# Patient Record
Sex: Male | Born: 1965 | Race: White | Hispanic: No | Marital: Married | Smoking: Current every day smoker
Health system: Southern US, Community
[De-identification: ages and names within clinical notes are randomized; demographics above are authoritative.]

## PROBLEM LIST (undated history)

## (undated) DIAGNOSIS — F419 Anxiety disorder, unspecified: Secondary | ICD-10-CM

## (undated) DIAGNOSIS — F32A Depression, unspecified: Secondary | ICD-10-CM

## (undated) DIAGNOSIS — F329 Major depressive disorder, single episode, unspecified: Secondary | ICD-10-CM

## (undated) HISTORY — PX: NECK SURGERY: SHX720

---

## 2005-06-06 ENCOUNTER — Emergency Department: Payer: Self-pay | Admitting: Emergency Medicine

## 2005-06-22 ENCOUNTER — Emergency Department: Payer: Self-pay | Admitting: Emergency Medicine

## 2005-09-01 ENCOUNTER — Other Ambulatory Visit: Payer: Self-pay

## 2005-09-01 ENCOUNTER — Emergency Department: Payer: Self-pay | Admitting: Unknown Physician Specialty

## 2008-10-10 ENCOUNTER — Emergency Department: Payer: Self-pay | Admitting: Emergency Medicine

## 2008-11-17 ENCOUNTER — Emergency Department: Payer: Self-pay | Admitting: Emergency Medicine

## 2008-11-25 ENCOUNTER — Emergency Department: Payer: Self-pay | Admitting: Emergency Medicine

## 2008-11-29 ENCOUNTER — Emergency Department: Payer: Self-pay | Admitting: Unknown Physician Specialty

## 2009-08-24 ENCOUNTER — Emergency Department: Payer: Self-pay | Admitting: Unknown Physician Specialty

## 2009-10-14 ENCOUNTER — Emergency Department: Payer: Self-pay | Admitting: Emergency Medicine

## 2010-02-23 ENCOUNTER — Emergency Department: Payer: Self-pay | Admitting: Emergency Medicine

## 2010-07-22 ENCOUNTER — Emergency Department: Payer: Self-pay | Admitting: Emergency Medicine

## 2011-04-14 ENCOUNTER — Emergency Department: Payer: Self-pay | Admitting: Emergency Medicine

## 2012-04-30 ENCOUNTER — Emergency Department: Payer: Self-pay | Admitting: Emergency Medicine

## 2012-07-24 ENCOUNTER — Ambulatory Visit: Payer: Self-pay | Admitting: Family Medicine

## 2012-08-28 ENCOUNTER — Ambulatory Visit: Payer: Self-pay | Admitting: Orthopedic Surgery

## 2012-09-03 ENCOUNTER — Emergency Department: Payer: Self-pay | Admitting: Emergency Medicine

## 2012-09-06 ENCOUNTER — Ambulatory Visit: Payer: Self-pay | Admitting: Family Medicine

## 2012-10-23 ENCOUNTER — Emergency Department: Payer: Self-pay | Admitting: Emergency Medicine

## 2012-12-11 ENCOUNTER — Emergency Department: Payer: Self-pay | Admitting: Emergency Medicine

## 2013-03-14 ENCOUNTER — Emergency Department: Payer: Self-pay | Admitting: Emergency Medicine

## 2013-03-14 LAB — URINALYSIS, COMPLETE
Bacteria: NONE SEEN
Bilirubin,UR: NEGATIVE
Glucose,UR: NEGATIVE mg/dL (ref 0–75)
Ketone: NEGATIVE
Protein: 30
RBC,UR: 1478 /HPF (ref 0–5)
Specific Gravity: 1.023 (ref 1.003–1.030)
WBC UR: 1 /HPF (ref 0–5)

## 2013-03-14 LAB — BASIC METABOLIC PANEL
BUN: 8 mg/dL (ref 7–18)
Calcium, Total: 9.2 mg/dL (ref 8.5–10.1)
Co2: 26 mmol/L (ref 21–32)
Creatinine: 0.95 mg/dL (ref 0.60–1.30)
EGFR (Non-African Amer.): >60
Osmolality: 270 (ref 275–301)

## 2013-03-14 LAB — CBC
HGB: 13.6 g/dL (ref 13.0–18.0)
MCV: 88 fL (ref 80–100)
Platelet: 257 10*3/uL (ref 150–440)
RBC: 4.32 10*6/uL — ABNORMAL LOW (ref 4.40–5.90)
WBC: 6.1 10*3/uL (ref 3.8–10.6)

## 2013-06-28 ENCOUNTER — Emergency Department: Payer: Self-pay | Admitting: Emergency Medicine

## 2013-06-28 LAB — URINALYSIS, COMPLETE
BILIRUBIN, UR: NEGATIVE
BLOOD: NEGATIVE
Bacteria: NONE SEEN
GLUCOSE, UR: NEGATIVE mg/dL (ref 0–75)
KETONE: NEGATIVE
Leukocyte Esterase: NEGATIVE
Nitrite: NEGATIVE
Ph: 6 (ref 4.5–8.0)
Protein: NEGATIVE
RBC,UR: NONE SEEN /HPF (ref 0–5)
Specific Gravity: 1.01 (ref 1.003–1.030)
Squamous Epithelial: NONE SEEN
WBC UR: 1 /HPF (ref 0–5)

## 2013-06-28 LAB — COMPREHENSIVE METABOLIC PANEL
ALBUMIN: 4.4 g/dL (ref 3.4–5.0)
AST: 37 U/L (ref 15–37)
Alkaline Phosphatase: 95 U/L
Anion Gap: 5 — ABNORMAL LOW (ref 7–16)
BILIRUBIN TOTAL: 0.3 mg/dL (ref 0.2–1.0)
BUN: 9 mg/dL (ref 7–18)
CO2: 27 mmol/L (ref 21–32)
Calcium, Total: 8.9 mg/dL (ref 8.5–10.1)
Chloride: 103 mmol/L (ref 98–107)
Creatinine: 0.75 mg/dL (ref 0.60–1.30)
GLUCOSE: 143 mg/dL — AB (ref 65–99)
Osmolality: 271 (ref 275–301)
Potassium: 3.9 mmol/L (ref 3.5–5.1)
SGPT (ALT): 43 U/L (ref 12–78)
SODIUM: 135 mmol/L — AB (ref 136–145)
Total Protein: 8.1 g/dL (ref 6.4–8.2)

## 2013-06-28 LAB — ETHANOL
ETHANOL LVL: 104 mg/dL
Ethanol %: 0.104 % — ABNORMAL HIGH (ref 0.000–0.080)

## 2013-06-28 LAB — CBC
HCT: 39.1 % — ABNORMAL LOW (ref 40.0–52.0)
HGB: 13.9 g/dL (ref 13.0–18.0)
MCH: 31.6 pg (ref 26.0–34.0)
MCHC: 35.5 g/dL (ref 32.0–36.0)
MCV: 89 fL (ref 80–100)
Platelet: 229 10*3/uL (ref 150–440)
RBC: 4.38 10*6/uL — ABNORMAL LOW (ref 4.40–5.90)
RDW: 13.2 % (ref 11.5–14.5)
WBC: 6.4 10*3/uL (ref 3.8–10.6)

## 2013-06-28 LAB — DRUG SCREEN, URINE
Amphetamines, Ur Screen: NEGATIVE (ref ?–1000)
BARBITURATES, UR SCREEN: NEGATIVE (ref ?–200)
BENZODIAZEPINE, UR SCRN: POSITIVE (ref ?–200)
Cannabinoid 50 Ng, Ur ~~LOC~~: NEGATIVE (ref ?–50)
Cocaine Metabolite,Ur ~~LOC~~: NEGATIVE (ref ?–300)
MDMA (ECSTASY) UR SCREEN: NEGATIVE (ref ?–500)
Methadone, Ur Screen: NEGATIVE (ref ?–300)
Opiate, Ur Screen: POSITIVE (ref ?–300)
Phencyclidine (PCP) Ur S: NEGATIVE (ref ?–25)
Tricyclic, Ur Screen: NEGATIVE (ref ?–1000)

## 2013-06-28 LAB — ACETAMINOPHEN LEVEL: Acetaminophen: <2 ug/mL

## 2013-06-28 LAB — SALICYLATE LEVEL: Salicylates, Serum: <1.7 mg/dL

## 2013-10-08 ENCOUNTER — Emergency Department: Payer: Self-pay | Admitting: Emergency Medicine

## 2014-04-01 IMAGING — CT CT STONE STUDY
1 of 2 series · 16 of 32 positions shown, 20 images · non-contrast
Comparison: none

REASON FOR EXAM: right flank pain
COMMENTS:

PROCEDURE:     CT  - CT ABDOMEN /PELVIS WO (STONE)  - March 14, 2013  [DATE]
RESULT:     History: Right flank pain.
Comparison Study: Renal ultrasound 04/15/2011.

[Series 2: 3mm soft tissue · axial · 0.68mm/px · z∈[-510,-94]mm · 16 of 153 slices shown, 20 images]
[im 7/153  soft-tissue]
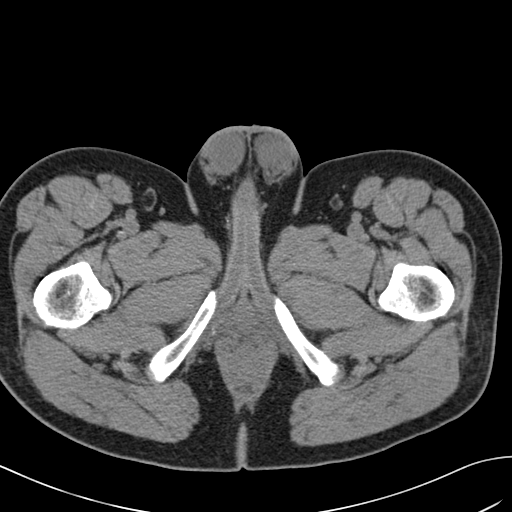
[im 7/153  bone]
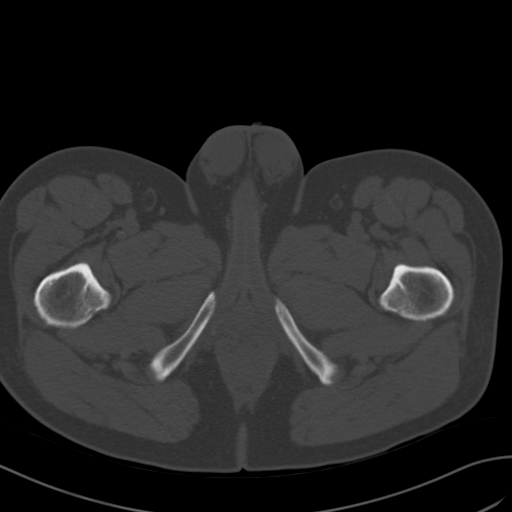
[im 20/153  soft-tissue]
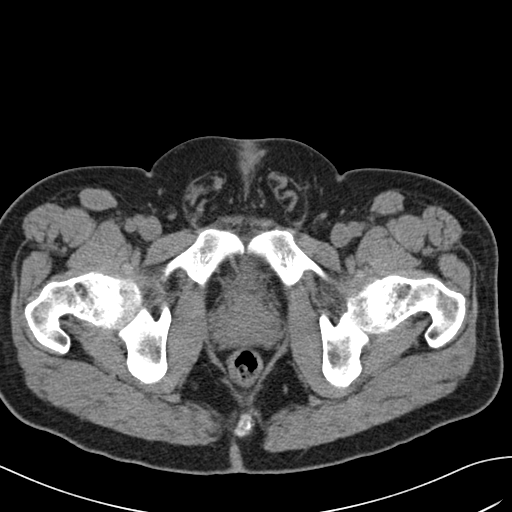
[im 32/153  soft-tissue]
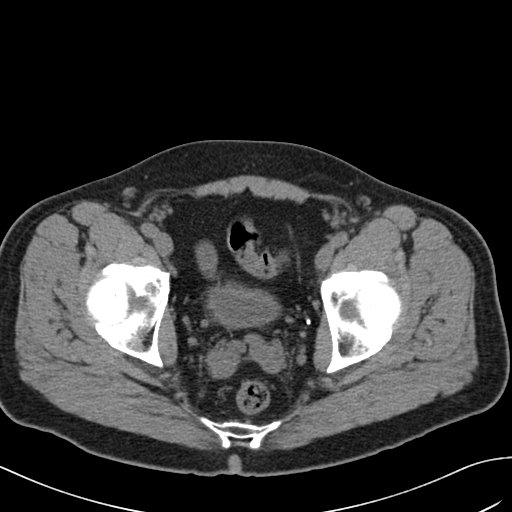
[im 39/153  soft-tissue]
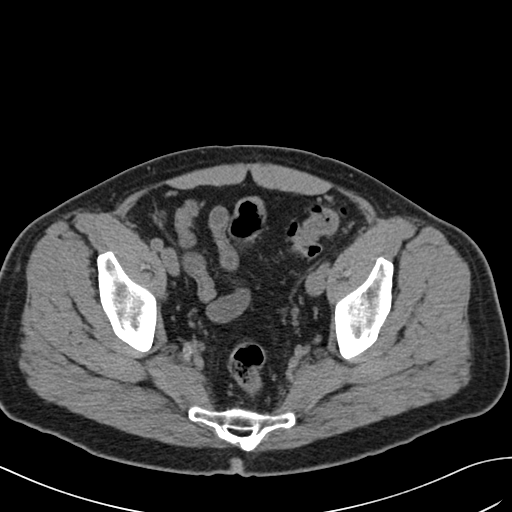
[im 51/153  soft-tissue]
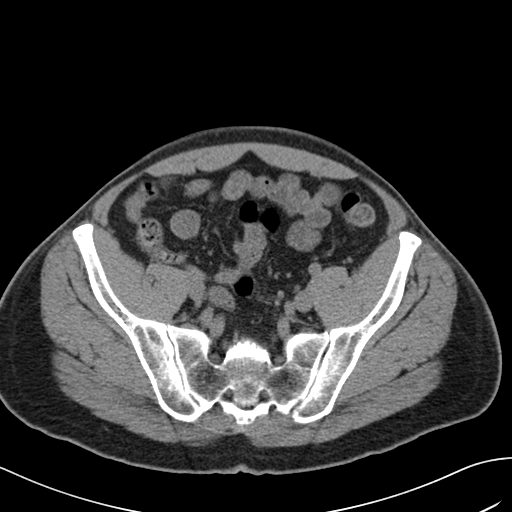
[im 64/153  soft-tissue]
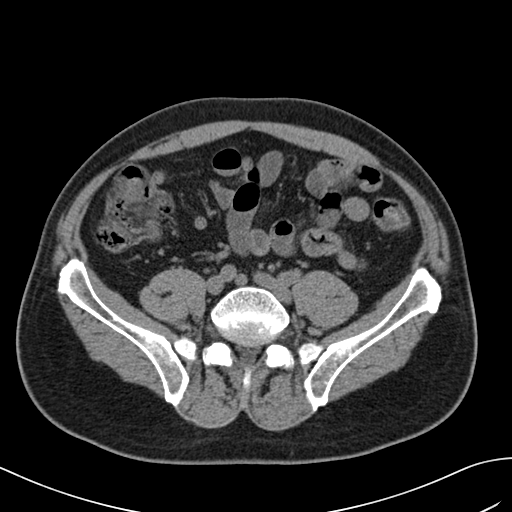
[im 70/153  soft-tissue]
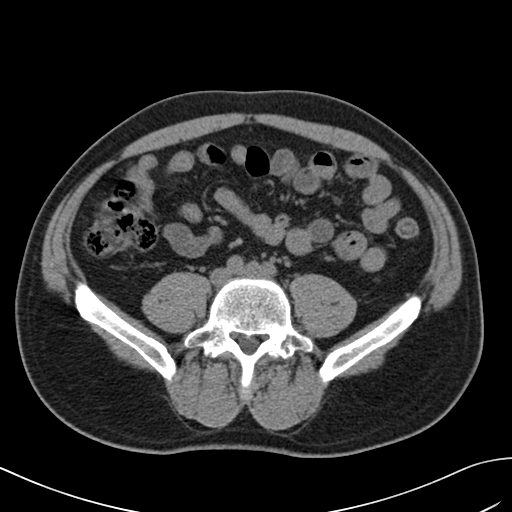
[im 83/153  soft-tissue]
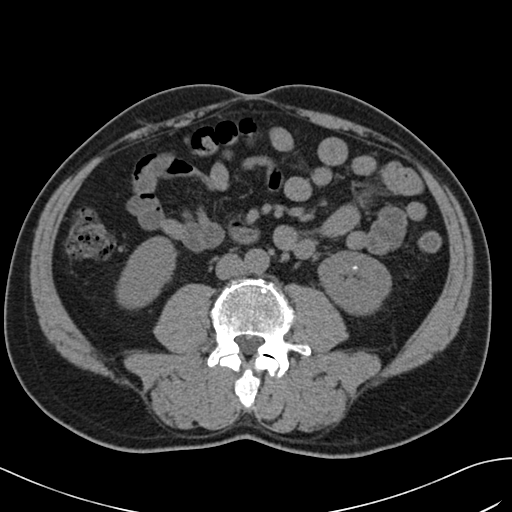
[im 89/153  soft-tissue]
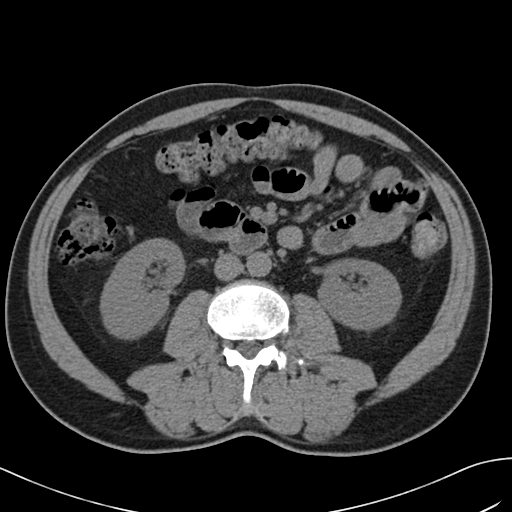
[im 89/153  bone]
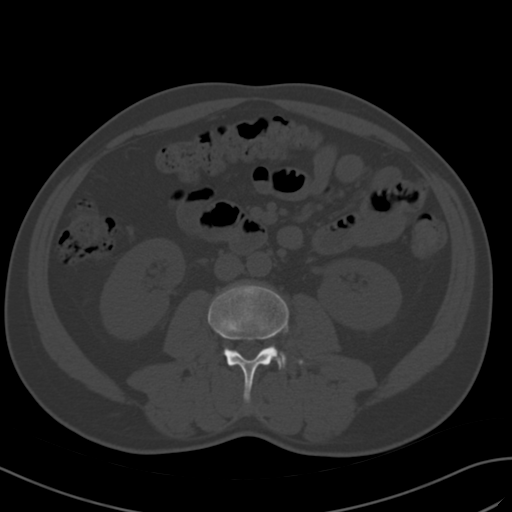
[im 102/153  soft-tissue]
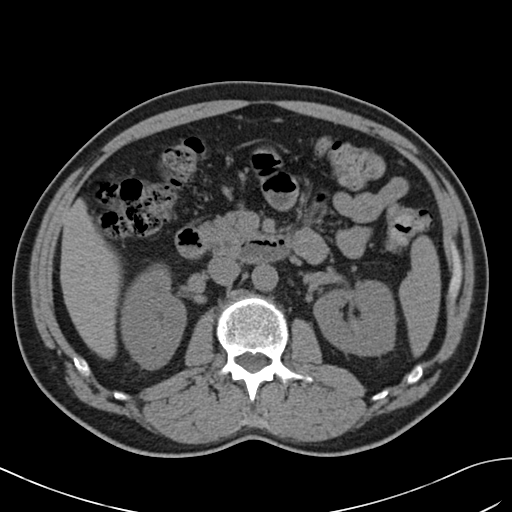
[im 115/153  soft-tissue]
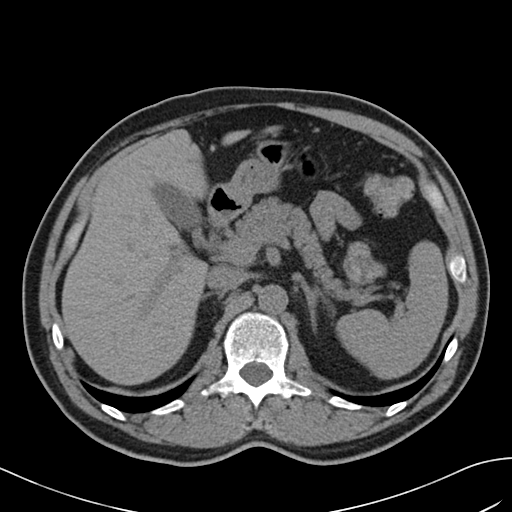
[im 121/153  soft-tissue]
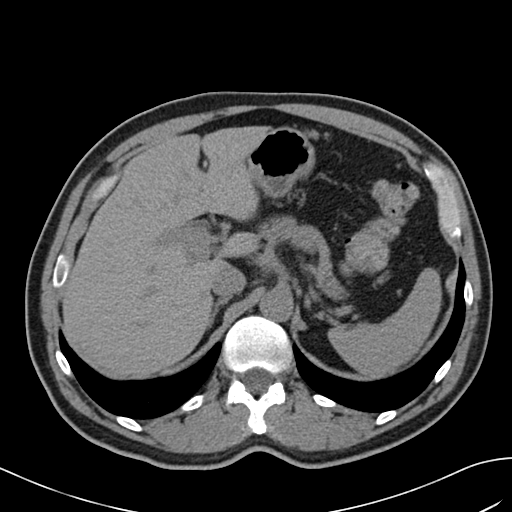
[im 127/153  lung]
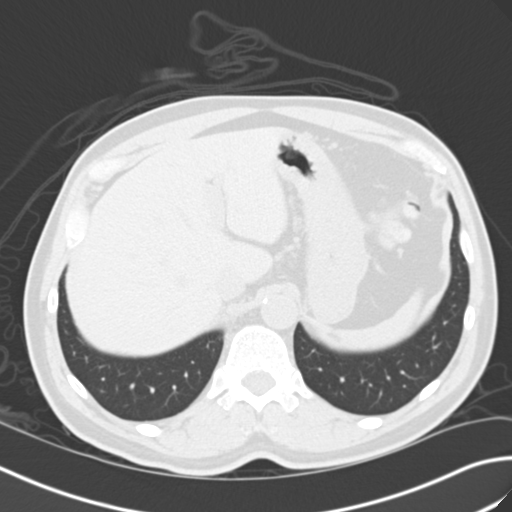
[im 134/153  soft-tissue]
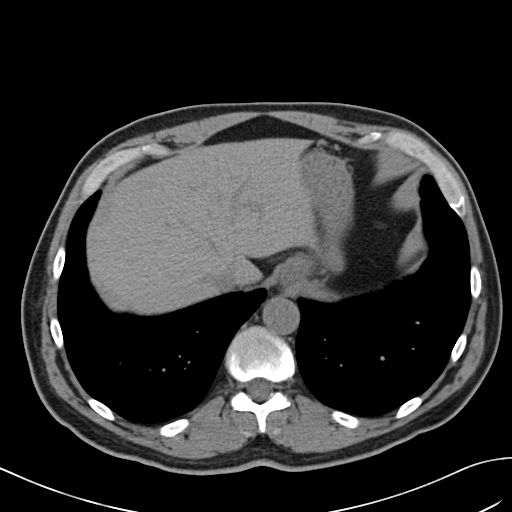
[im 134/153  lung]
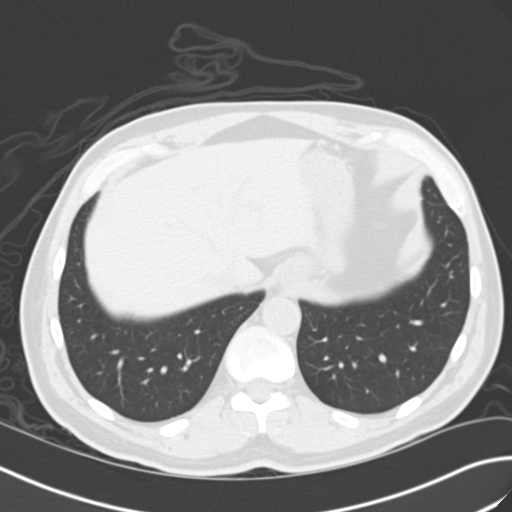
[im 140/153  lung]
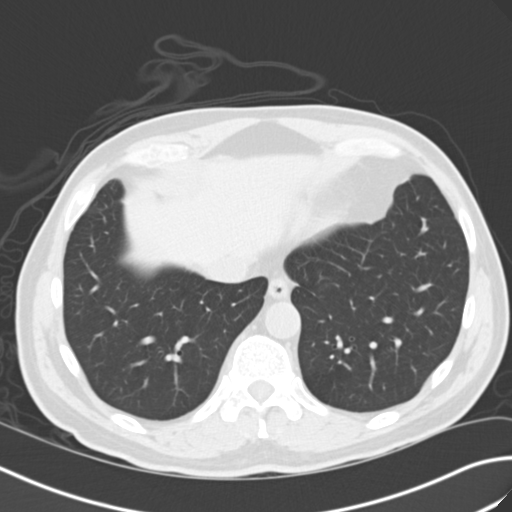
[im 146/153  soft-tissue]
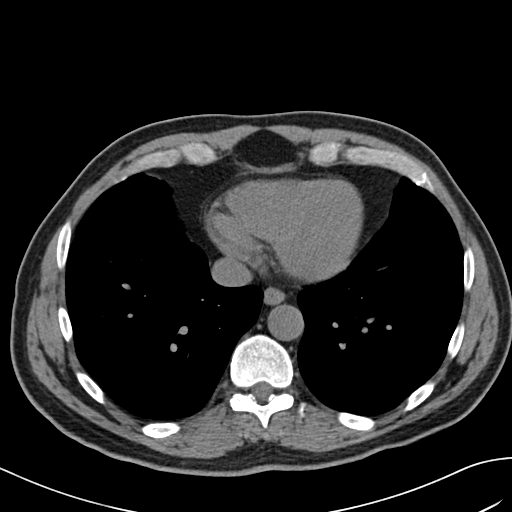
[im 146/153  lung]
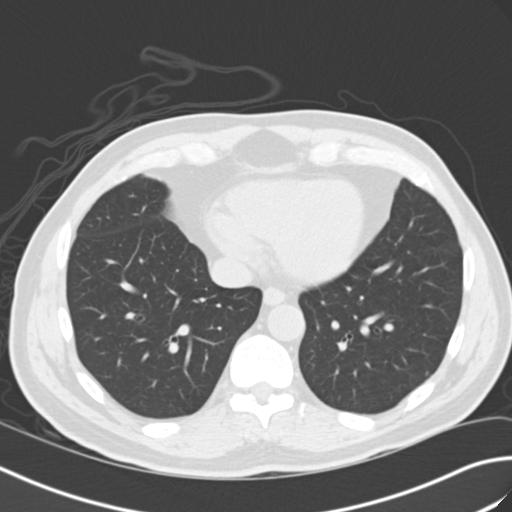

[16 of 32 positions shown; findings below may reference images not displayed]

FINDINGS: Standard nonenhanced CT obtained. Evaluation in 3 dimensions on
separate workstation performed. Liver normal. Spleen normal. Pancreas is
normal. No biliary distention. Gallbladder nondistended.

Adrenals normal. Bilateral nephrolithiasis is present. No urolithiasis.
Bladder is nondistended. Prostate is slightly prominent. No free pelvic
fluid.

No adenopathy. Aorta normal caliber.

Kidneys normal. No bowel distention or free air. Esophageal gastric regions
are normal.

Lung bases are clear. Heart size normal. No acute bony abnormality.
IMPRESSION: Bilateral nonobstructing renal calyceal stones. No evidence
of ureteral stone.

## 2014-10-24 ENCOUNTER — Emergency Department
Admission: EM | Admit: 2014-10-24 | Discharge: 2014-10-24 | Disposition: A | Discharge status: Home / Self Care | Payer: Self-pay | Attending: Emergency Medicine | Admitting: Emergency Medicine

## 2014-10-24 ENCOUNTER — Encounter: Payer: Self-pay | Admitting: Emergency Medicine

## 2014-10-24 DIAGNOSIS — F329 Major depressive disorder, single episode, unspecified: Secondary | ICD-10-CM | POA: Insufficient documentation

## 2014-10-24 DIAGNOSIS — Z720 Tobacco use: Secondary | ICD-10-CM | POA: Insufficient documentation

## 2014-10-24 DIAGNOSIS — F32A Depression, unspecified: Secondary | ICD-10-CM

## 2014-10-24 DIAGNOSIS — F1210 Cannabis abuse, uncomplicated: Secondary | ICD-10-CM | POA: Insufficient documentation

## 2014-10-24 DIAGNOSIS — Z79899 Other long term (current) drug therapy: Secondary | ICD-10-CM | POA: Insufficient documentation

## 2014-10-24 DIAGNOSIS — F419 Anxiety disorder, unspecified: Secondary | ICD-10-CM | POA: Insufficient documentation

## 2014-10-24 HISTORY — DX: Major depressive disorder, single episode, unspecified: F32.9

## 2014-10-24 HISTORY — DX: Anxiety disorder, unspecified: F41.9

## 2014-10-24 HISTORY — DX: Depression, unspecified: F32.A

## 2014-10-24 LAB — CBC WITH DIFFERENTIAL/PLATELET
BASOS PCT: 1 %
Basophils Absolute: 0.1 10*3/uL (ref 0–0.1)
EOS ABS: 0.2 10*3/uL (ref 0–0.7)
Eosinophils Relative: 1 %
HCT: 45.6 % (ref 40.0–52.0)
Hemoglobin: 15.3 g/dL (ref 13.0–18.0)
Lymphocytes Relative: 15 %
Lymphs Abs: 1.6 10*3/uL (ref 1.0–3.6)
MCH: 31.6 pg (ref 26.0–34.0)
MCHC: 33.6 g/dL (ref 32.0–36.0)
MCV: 94 fL (ref 80.0–100.0)
Monocytes Absolute: 0.5 10*3/uL (ref 0.2–1.0)
Monocytes Relative: 5 %
NEUTROS ABS: 8.4 10*3/uL — AB (ref 1.4–6.5)
NEUTROS PCT: 78 %
PLATELETS: 237 10*3/uL (ref 150–440)
RBC: 4.86 MIL/uL (ref 4.40–5.90)
RDW: 14 % (ref 11.5–14.5)
WBC: 10.8 10*3/uL — ABNORMAL HIGH (ref 3.8–10.6)

## 2014-10-24 LAB — COMPREHENSIVE METABOLIC PANEL
ALBUMIN: 4.4 g/dL (ref 3.5–5.0)
ALT: 30 U/L (ref 17–63)
AST: 29 U/L (ref 15–41)
Alkaline Phosphatase: 69 U/L (ref 38–126)
Anion gap: 6 (ref 5–15)
BUN: 10 mg/dL (ref 6–20)
CO2: 30 mmol/L (ref 22–32)
CREATININE: 0.87 mg/dL (ref 0.61–1.24)
Calcium: 9.3 mg/dL (ref 8.9–10.3)
Chloride: 104 mmol/L (ref 101–111)
GFR calc Af Amer: >60 mL/min (ref >60)
GLUCOSE: 82 mg/dL (ref 65–99)
Potassium: 3.9 mmol/L (ref 3.5–5.1)
Sodium: 140 mmol/L (ref 135–145)
Total Bilirubin: 0.4 mg/dL (ref 0.3–1.2)
Total Protein: 7.5 g/dL (ref 6.5–8.1)

## 2014-10-24 LAB — URINE DRUG SCREEN, QUALITATIVE (ARMC ONLY)
Amphetamines, Ur Screen: NOT DETECTED
BARBITURATES, UR SCREEN: NOT DETECTED
BENZODIAZEPINE, UR SCRN: NOT DETECTED
CANNABINOID 50 NG, UR ~~LOC~~: POSITIVE — AB
COCAINE METABOLITE, UR ~~LOC~~: NOT DETECTED
MDMA (Ecstasy)Ur Screen: NOT DETECTED
Methadone Scn, Ur: NOT DETECTED
OPIATE, UR SCREEN: NOT DETECTED
Phencyclidine (PCP) Ur S: NOT DETECTED
Tricyclic, Ur Screen: NOT DETECTED

## 2014-10-24 LAB — ETHANOL: Alcohol, Ethyl (B): 52 mg/dL — ABNORMAL HIGH (ref <5)

## 2014-10-24 NOTE — ED Notes (Signed)
Pt states the he took his wife klonopin his am, to help with his anxiety. Pt states that he is extremely anxious and he is scared that he might hurt someone. Pt was brought in by his brother. Pt was pleasant to talk with, pt in NAD at this time will cont to monitor pt at all times.

## 2014-10-24 NOTE — ED Notes (Signed)
Patient verbalized understanding of instructions given, no further questions verbalized. Unable to sign e-sign at this time due to system malfunction.

## 2014-10-24 NOTE — ED Notes (Addendum)
Pt reports feeling like he is having a nervous breakdown for the last several months, pt denies SI,HI

## 2014-10-24 NOTE — BH Assessment (Signed)
Assessment Note  Willie Lane is an 49 y.o. male Who presents to the ER via a friend due to concerns about his anxiety. He states it's frequency and intensity has increased over the last couple days. Symptoms he reports of having are; shortenings of breath, tightening chest and sweating. Pt. denies SI/HI and AV/H. He does admit to being easily agitated and frustrated. He is currently being followed by Dr Suzie PortelaMoffitt, with [facility]. He is currently taking Prozac to help with is depression. His last appointment was 09/09/2014. He reports of being giving a two-month supply of medication. He spoke with the Psych MD about his current medications and the Dr. felt the pt. was in need of any changes. Pt. admits to an increase of alcohol use, to help cope through his anxiety. For the last month, he has been drinking on daily basis, in the amount of 40 oz's.    He acknowledges he has his wife, two children and a friend as support.  According to him, his wife doesn't agree with his drinking. He friend doesn't agree about his drinking as well. He and his friend attend the same church. 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  He identified his job as the primary stressor in his life. He and a few of his co-workers don't get alone. He denies having any altercation with them. The most he reports of doing is, getting upset and walking away.    Axis I: Anxiety Disorder NOS and Depressive Disorder NOS Axis III:  Past Medical History  Diagnosis Date  . Anxiety   . Depressed    Axis IV: economic problems, occupational problems, other psychosocial or environmental problems and problems related to social environment  Past Medical History:  Past Medical History  Diagnosis Date  . Anxiety   . Depressed     Past Surgical History  Procedure Laterality Date  . Neck surgery      Family History: No family history on file.  Social History:  reports that he has been smoking.   He does not have any smokeless tobacco history on file. He reports that he drinks alcohol. He reports that he does not use illicit drugs.  Additional Social History:  Alcohol / Drug Use Pain Medications: None reported Prescriptions: None reported Over the Counter: None reported History of alcohol / drug use?: Yes Longest period of sobriety (when/how long): Unknown Negative Consequences of Use: Personal relationships Withdrawal Symptoms:  (None reported) Substance #1 Name of Substance 1: Alochol 1 - Age of First Use: Teenager 1 - Amount (size/oz): 40oz 1 - Frequency: Daily 1 - Duration: Approximately Month 1 - Last Use / Amount: 10/23/2014  CIWA: CIWA-Ar BP: 121/67 mmHg Pulse Rate: 82 COWS:    Allergies: No Known Allergies  Home Medications:  (Not in a hospital admission)  OB/GYN Status:  No LMP for male patient.  General Assessment Data [facility] Assessment: In system Is this a Tele or Face-to-Face Assessment?: Face-to-Face Is this an Initial Assessment or a Re-assessment for this encounter?: Initial Assessment Marital status: Married Is patient pregnant?: No Living Arrangements: Spouse/significant other (Wife) Can pt return to current living arrangement?: Yes Admission Status: Voluntary Is patient capable of signing voluntary admission?: Yes Referral Source: Self/Family/Friend  Medical Screening Exam [facility] Walk-in ONLY) Medical Exam completed: Yes  Crisis Care Plan Living Arrangements: Spouse/significant other (Wife) Name of Psychiatrist: Dr. Suzie PortelaMoffitt  Education Status Is patient currently in school?:  No Highest grade of school patient has completed: 12  Risk to self with the past 6 months Suicidal Ideation: No Has patient been a risk to self within the past 6 months prior to admission? : No Suicidal Intent: No Has patient had any suicidal intent within the past 6 months prior to admission? : No Is patient at risk for suicide?:  No Suicidal Plan?: No Has patient had any suicidal plan within the past 6 months prior to admission? : No Access to Means: No What has been your use of drugs/alcohol within the last 12 months?: Alcohol use Previous Attempts/Gestures: No How many times?: 0 Other Self Harm Risks: None reporte Intentional Self Injurious Behavior: None Family Suicide History: No Recent stressful life event(s):  (Recent Job stress) Persecutory voices/beliefs?: No Depression: Yes Depression Symptoms: Loss of interest in usual pleasures, Feeling angry/irritable Substance abuse history and/or treatment for substance abuse?: Yes (Alcohol) Suicide prevention information given to non-admitted patients: Not applicable  Risk to Others within the past 6 months Homicidal Ideation: No Does patient have any lifetime risk of violence toward others beyond the six months prior to admission? : No Thoughts of Harm to Others: No Current Homicidal Intent: No Current Homicidal Plan: No Access to Homicidal Means: No Identified Victim: None reported History of harm to others?: No Assessment of Violence: None Noted Violent Behavior Description: None reported Does patient have access to weapons?: No Criminal Charges Pending?: No Does patient have a court date: No Is patient on probation?: No  Psychosis Hallucinations: None noted Delusions: None noted  Mental Status Report Appearance/Hygiene: In scrubs, Unremarkable Eye Contact: Fair Motor Activity: Unremarkable Speech: Logical/coherent Level of Consciousness: Alert Mood: Anxious, Depressed Affect: Appropriate to circumstance Anxiety Level: Minimal Thought Processes: Coherent, Relevant Judgement: Other (Comment) (WNR) Orientation: Person, Place, Time, Situation, Appropriate for developmental age Obsessive Compulsive Thoughts/Behaviors: None  Cognitive Functioning Concentration: Normal Memory: Recent Intact, Remote Intact IQ: Average Insight: Fair Impulse  Control: Fair Appetite: Good Weight Loss: 0 Weight Gain: 0 Sleep: No Change Total Hours of Sleep: 8 Vegetative Symptoms: None  ADLScreening [facility] Assessment Services) Patient's cognitive ability adequate to safely complete daily activities?: No Patient able to express need for assistance with ADLs?: Yes Independently performs ADLs?: Yes (appropriate for developmental age)  Prior Inpatient Therapy Prior Inpatient Therapy: No  Prior Outpatient Therapy Prior Outpatient Therapy: Yes Prior Therapy Dates: Current Prior Therapy Facilty/Provider(s): [facility] Reason for Treatment: Depression Does patient have an ACCT team?: No Does patient have Intensive In-House Services?  : No Does patient have [facility] services? : No Does patient have P4CC services?: No  ADL Screening (condition at time of admission) Patient's cognitive ability adequate to safely complete daily activities?: No Patient able to express need for assistance with ADLs?: Yes Independently performs ADLs?: Yes (appropriate for developmental age)       Abuse/Neglect Assessment (Assessment to be complete while patient is alone) Physical Abuse: Denies Verbal Abuse: Denies Sexual Abuse: Denies Exploitation of patient/patient's resources: Denies Self-Neglect: Denies Values / Beliefs Cultural Requests During Hospitalization: None Spiritual Requests During Hospitalization: None Consults Spiritual Care Consult Needed: No Social Work Consult Needed: No Merchant navy officerAdvance Directives (For Healthcare) Does patient have an advance directive?: No    Additional Information 1:1 In Past 12 Months?: No CIRT Risk: No Elopement Risk: No Does patient have medical clearance?: Yes  Child/Adolescent Assessment Running Away Risk: Denies (Pt. is an adult)  Disposition:  Disposition Initial Assessment Completed for this Encounter: Yes Disposition of Patient: Outpatient treatment (Follow up  with [facility], his current provider) Type of outpatient  treatment: Adult  On Site Evaluation by:   Reviewed with Physician:    Morley Kos 10/24/2014 4:04 PM

## 2014-10-24 NOTE — ED Notes (Signed)
Pt denies any SI at this time but states he does have thoughts of HI. No acute distress noted in triage.  Pt came to hospital voluntary.

## 2014-10-24 NOTE — ED Provider Notes (Signed)
[facility] Emergency Department Provider Note    ____________________________________________  Time seen: 12:40 PM  I have reviewed the triage vital signs and the nursing notes.   HISTORY  Chief Complaint Psychiatric Evaluation  Anxiety, agitation, depression  HPI Willie Lane is a 49 y.o. male who presents reporting that he has a problem with anxiety. The anxiety leads him to get shaky at times. This is been going on for 4 months. He has had other episodes in the past including a psychiatric hospitalization many years ago. Willie Lane is on an SSRI prescribed by [facility]. He took his wife's Klonopin last night and this morning to see if it would help. He reports it did not help the anxiety. He reports he took 2 tablets this morning. He does not have any suicidal thoughts or intent of self-harm.  Yesterday Willie Lane became agitated with one of his coworkers. When I asked what he does when he gets mad and other people he reports he is able to walk away as he did yesterday. He has not had any physical altercations and he reports he does not believe he will. When asked about homicidal ideation, he reports he sometimes has thoughts when he is frustrated. They are not directed at any one individual.  Past Medical History  Diagnosis Date  . Anxiety   . Depressed     There are no active problems to display for this patient.   Past Surgical History  Procedure Laterality Date  . Neck surgery      Current Outpatient Rx  Name  Route  Sig  Dispense  Refill  . CALCIUM PO   Oral   Take 1 tablet by mouth daily.         . clonazePAM (KLONOPIN) 2 MG tablet   Oral   Take 2 mg by mouth daily.         Marland Kitchen. FLUoxetine (PROZAC) 40 MG capsule   Oral   Take 40 mg by mouth daily.         . Multiple Vitamin (MULTIVITAMIN WITH MINERALS) TABS tablet   Oral   Take 1 tablet by mouth daily.           Allergies Review of patient's allergies indicates no known  allergies.  No family history on file.  Social History History  Substance Use Topics  . Smoking status: Current Every Day Smoker -- 1.00 packs/day  . Smokeless tobacco: Not on file  . Alcohol Use: Yes    Review of Systems  Constitutional: Negative for fever.. ENT: Negative for sore throat. Cardiovascular: Negative for chest pain. Respiratory: Negative for shortness of breath. Gastrointestinal: Negative for abdominal pain, vomiting and diarrhea. Genitourinary: No dysuria. Musculoskeletal: Patient reports he has a long-term problem with back pain and with joint pain. Skin: Negative for rash. Neurological: Negative for headaches, focal weakness or numbness. Psychiatric:Anxiety, see history of present illness 10-point ROS otherwise negative.  ____________________________________________   PHYSICAL EXAM:  VITAL SIGNS: ED Triage Vitals  Enc Vitals Group     BP 10/24/14 1128 121/67 mmHg     Pulse Rate 10/24/14 1128 82     Resp 10/24/14 1128 18     Temp 10/24/14 1128 97.6 F (36.4 C)     Temp Source 10/24/14 1128 Oral     SpO2 10/24/14 1128 98 %     Weight 10/24/14 1128 180 lb (81.647 kg)     Height 10/24/14 1128 5\' 9"  (1.753 m)  Head Cir --      Peak Flow --      Pain Score 10/24/14 1129 8     Pain Loc --      Pain Edu? --      Excl. in GC? --      Constitutional: Alert and oriented. Well appearing and in no distress. The patient is calm, relaxed, and communicative. Eyes: Conjunctivae are normal.  Normal extraocular movements. ENT   Head: Normocephalic and atraumatic.   Nose: No congestion/rhinnorhea.   Mouth/Throat: Mucous membranes are moist.  Cardiovascular: Normal rate, regular rhythm. Normal and symmetric distal pulses are present in all extremities. No murmurs, rubs, or gallops. Respiratory: Normal respiratory effort without tachypnea nor retractions. Breath sounds are clear and equal bilaterally. No wheezes/rales/rhonchi. Gastrointestinal:  Soft and nontender. No distention. No abdominal bruits. There is no CVA tenderness. Musculoskeletal: Nontender with normal range of motion in all extremities. No joint effusions.  No lower extremity tenderness nor edema. Neurologic:  Normal speech and language. No gross focal neurologic deficits are appreciated. Speech is normal. No gait instability. Skin:  Skin is warm, dry and intact. No rash noted. Psychiatric: Mood and affect are normal. Speech and behavior are normal. Patient appears calm and communicative. He appears to have good insight into his situation, although he appears frustrated by the lack of benefit of prior treatment and medications. As mentioned above he denies suicidal ideation. He speaks in mixed terms about homicidal ideation. He reports that he sometimes has thoughts due to his frustrations and anger. These thoughts are not directed towards any single person. The patient does not believe he would act on these. He has had no physical altercations and on interview there is nothing that points towards him being a threat towards others. ____________________________________________    LABS (pertinent positives/negatives)  Laboratory results are overall unremarkable with a white blood's account of 10.8 and hemoglobin of 15.3.electrolytes are all within normal.  ____________________________________________    ____________________________________________    RADIOLOGY    ____________________________________________   PROCEDURES  Procedure(s) performed: None  Critical Care performed: No  ____________________________________________   INITIAL IMPRESSION / ASSESSMENT AND PLAN / ED COURSE  Pertinent labs & imaging results that were available during my care of the patient were reviewed by me and considered in my medical decision making (see chart for details).  Patient appears calm, relaxed, and does not appear to need inpatient hospitalization or involuntary  commitment at this time. He will be seen by the behavioral medicine intake evaluator in order to help facilitate ongoing outpatient care. Further disposition planning after they have seen him.  ----------------------------------------- 3:11 PM on 10/24/2014 -----------------------------------------  The patient has been seen by Mikey CollegeKelvin of behavioral medicine intake. He agrees with my assessment. The patient does not appear to be a threat to himself or to others. He has follow-up available to him [facility] where he has been treated before. I will not add any new medications at this time but rather let him be [facility] and [facility]. Patient be discharged with follow-up [place].  ____________________________________________   FINAL CLINICAL IMPRESSION(S) / ED DIAGNOSES  Final diagnoses:  Anxiety  Depression     Darien Ramusavid W Jusiah Aguayo, MD 10/24/14 [phone]

## 2014-10-24 NOTE — Discharge Instructions (Signed)
you appear calm and cooperative in the emergency department. [facility] We wish future return there for ongoing treatment. He appeared to be safe without threat to herself or others. If you have any thoughts of self-harm, suicide, or harm to others, return to the emergency department immediately. Continue your current medications. See [facility] in the next 1 day.

## 2014-10-26 IMAGING — CR DG CHEST 2V
1 series · 2 of 2 positions shown · non-contrast
Comparison: 09/06/2012

CLINICAL DATA: Cough, shortness of Breath

EXAM:
CHEST  2 VIEW

[Series 1: w chest pa · 0.14mm/px · 2 of 2 slices shown]
[im 1/2]
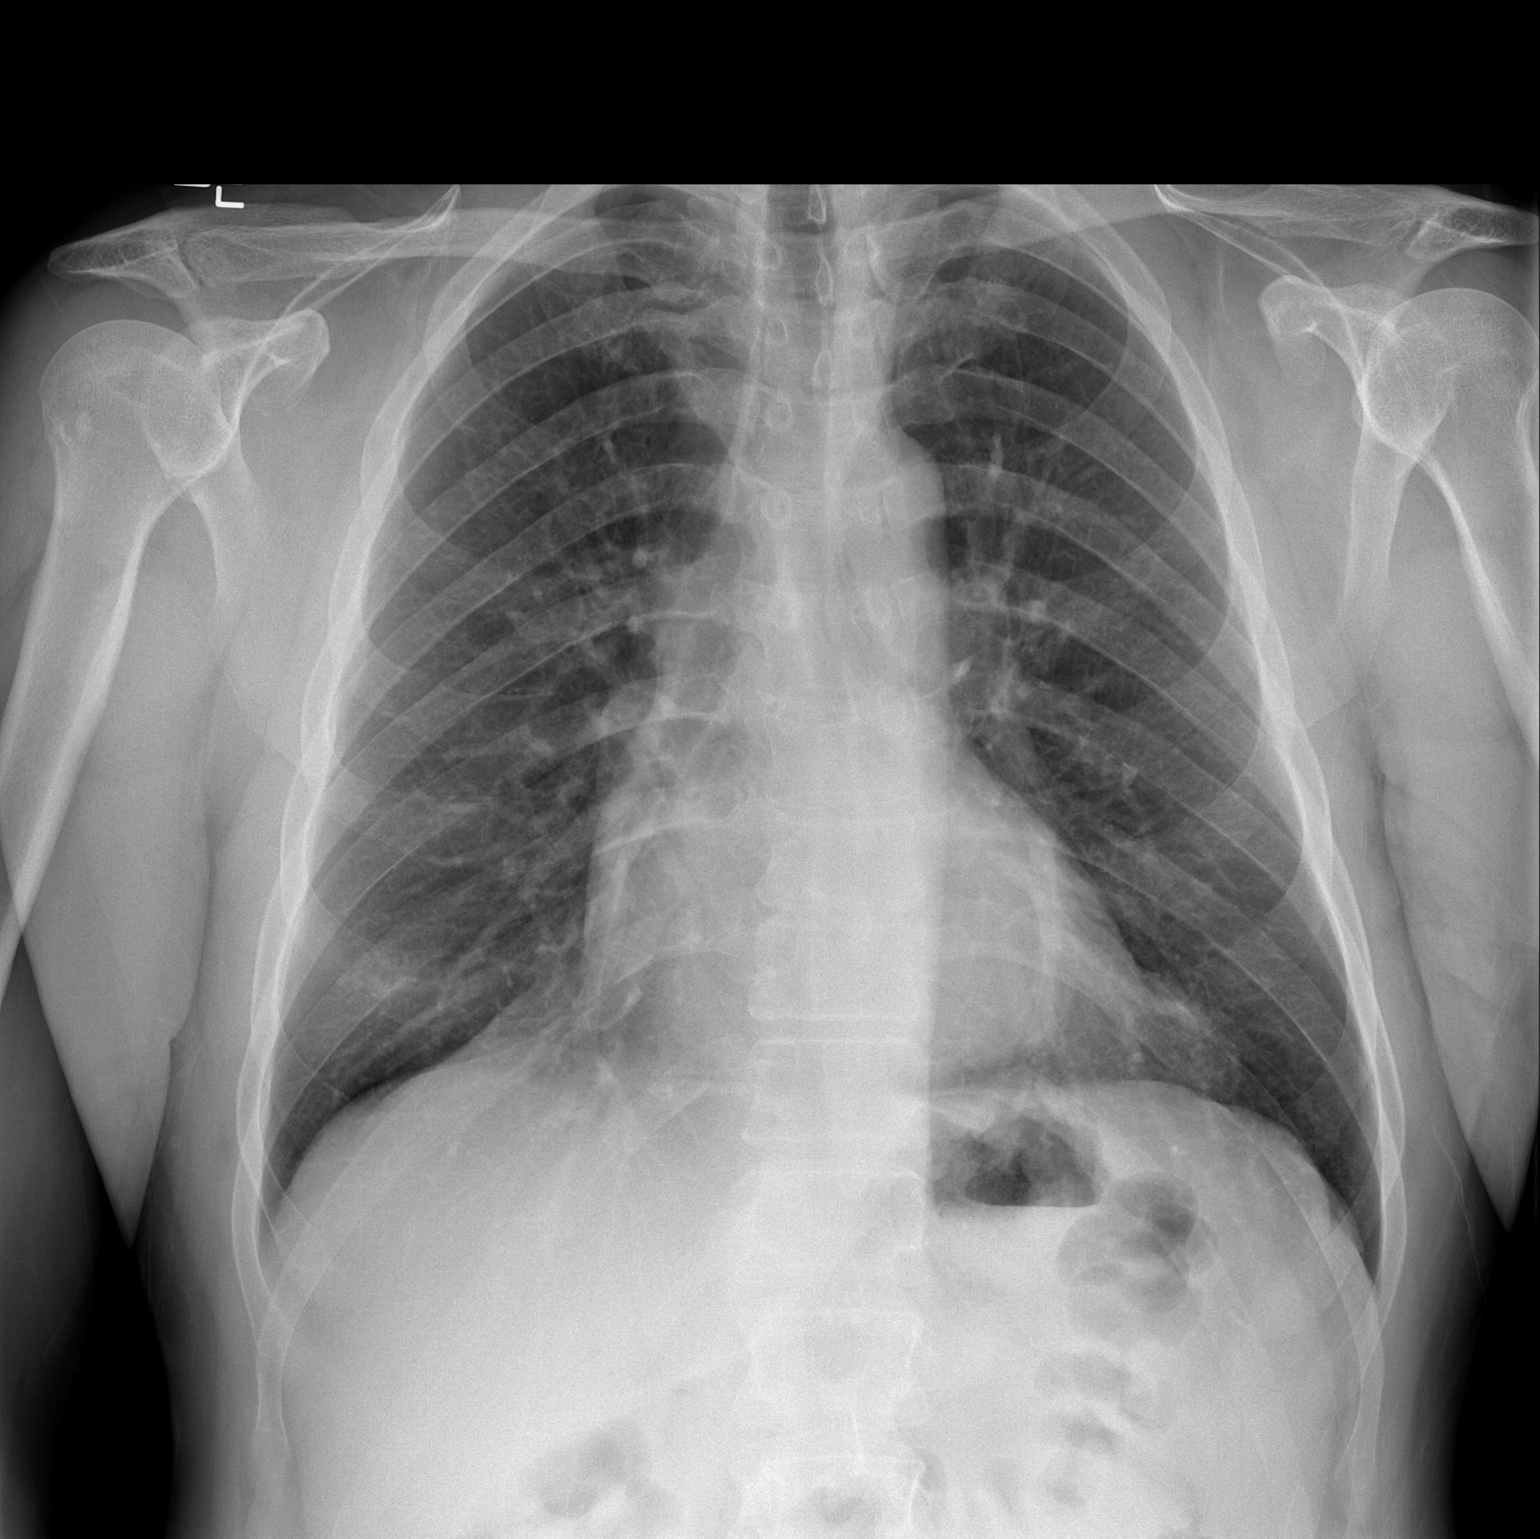
[im 2/2]
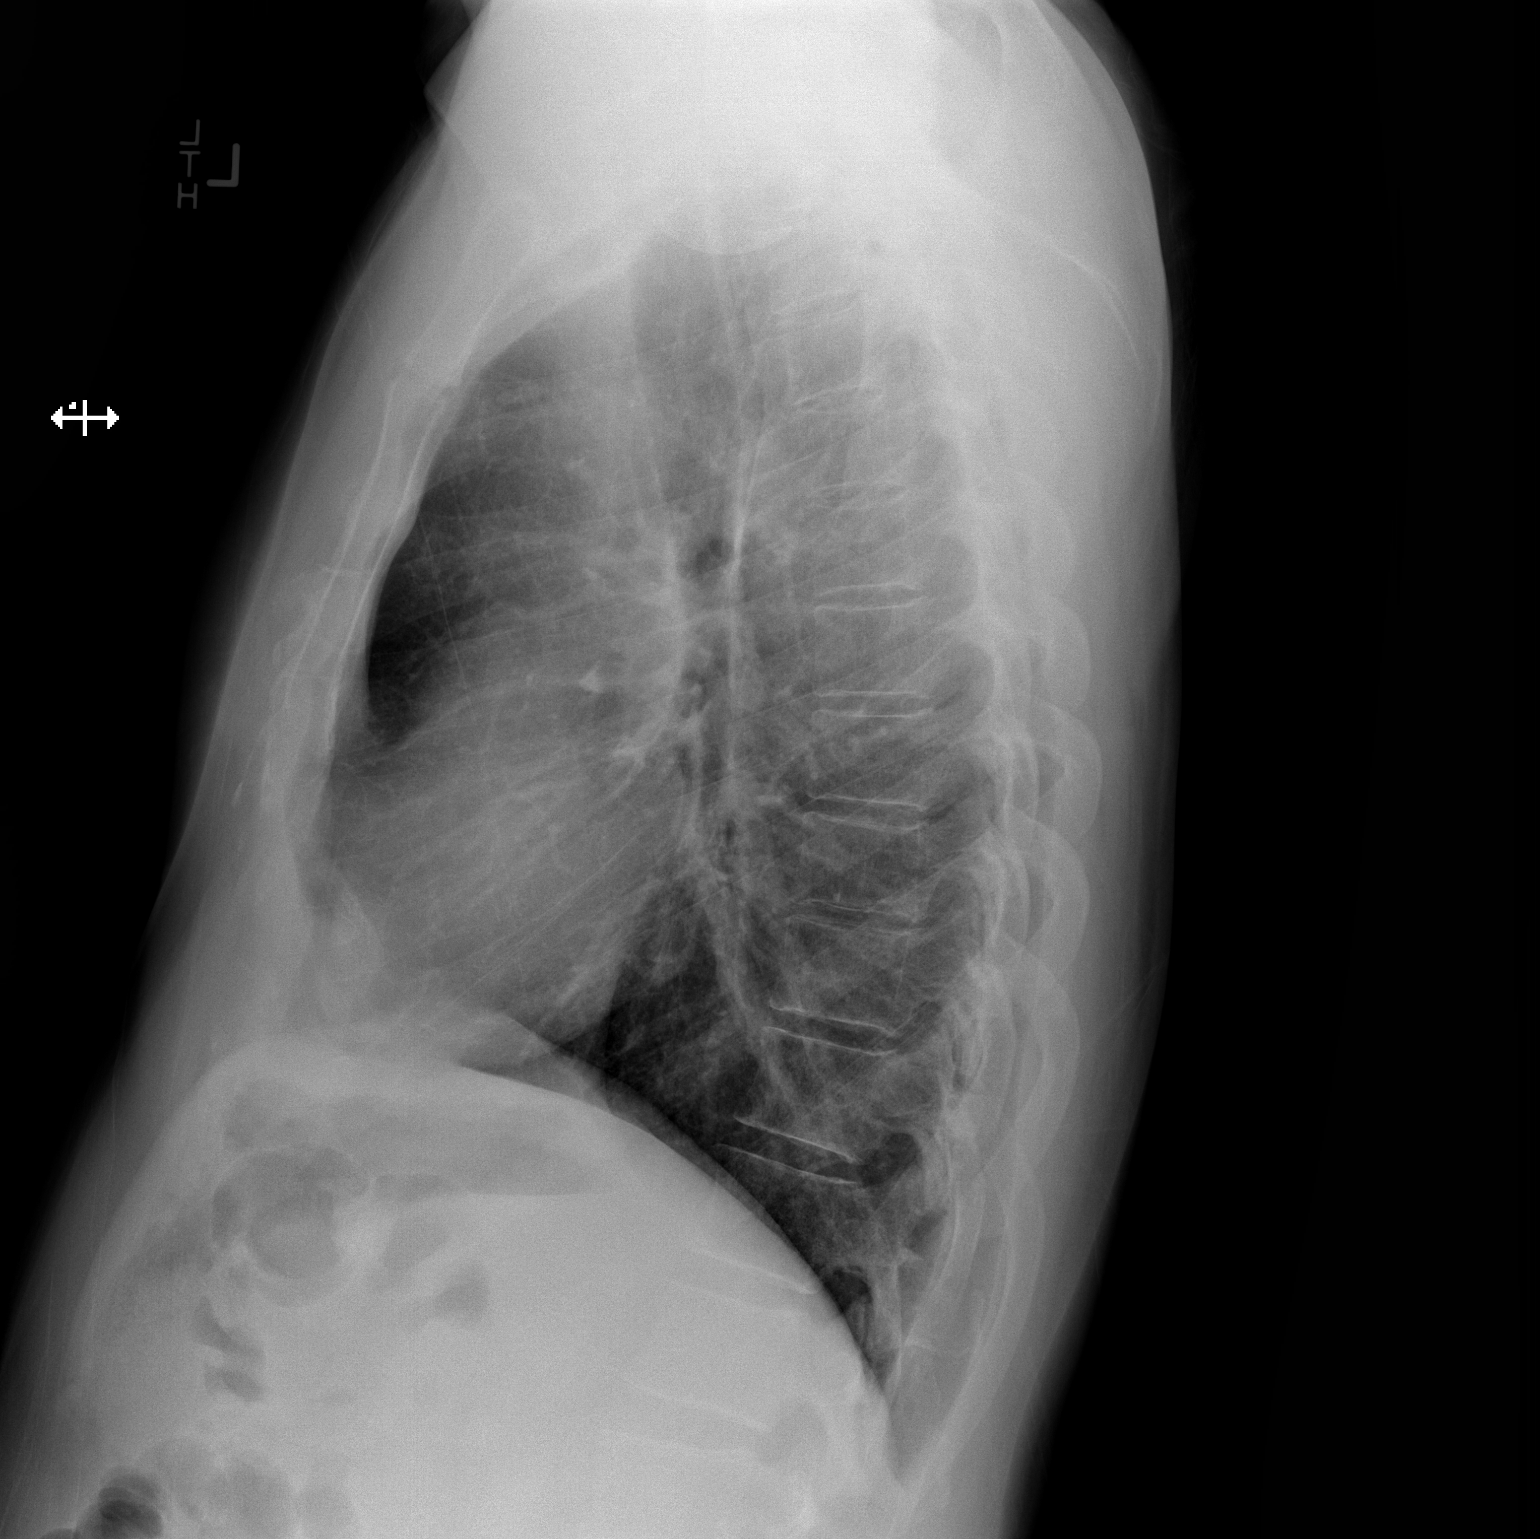

[2 of 2 positions shown; findings below may reference images not displayed]

FINDINGS: Cardiomediastinal silhouette is stable. Hyperinflation. No acute
infiltrate or pleural effusion. No pulmonary edema. Bony thorax is
unremarkable.
IMPRESSION: No active cardiopulmonary disease.
